# Patient Record
Sex: Male | Born: 1945 | Race: White | Hispanic: No | Marital: Married | Smoking: Former smoker
Health system: Southern US, Community
[De-identification: ages and names within clinical notes are randomized; demographics above are authoritative.]

## PROBLEM LIST (undated history)

## (undated) DIAGNOSIS — I639 Cerebral infarction, unspecified: Secondary | ICD-10-CM

## (undated) DIAGNOSIS — M109 Gout, unspecified: Secondary | ICD-10-CM

## (undated) DIAGNOSIS — E785 Hyperlipidemia, unspecified: Secondary | ICD-10-CM

## (undated) DIAGNOSIS — Z8719 Personal history of other diseases of the digestive system: Secondary | ICD-10-CM

## (undated) DIAGNOSIS — N189 Chronic kidney disease, unspecified: Secondary | ICD-10-CM

## (undated) DIAGNOSIS — C801 Malignant (primary) neoplasm, unspecified: Secondary | ICD-10-CM

## (undated) HISTORY — DX: Hyperlipidemia, unspecified: E78.5

## (undated) HISTORY — DX: Chronic kidney disease, unspecified: N18.9

## (undated) HISTORY — PX: KIDNEY SURGERY: SHX687

## (undated) HISTORY — DX: Cerebral infarction, unspecified: I63.9

## (undated) HISTORY — DX: Malignant (primary) neoplasm, unspecified: C80.1

## (undated) HISTORY — PX: HERNIA REPAIR: SHX51

## (undated) HISTORY — PX: FOOT FRACTURE SURGERY: SHX645

---

## 1998-08-31 ENCOUNTER — Ambulatory Visit (HOSPITAL_COMMUNITY): Admission: RE | Admit: 1998-08-31 | Discharge: 1998-08-31 | Payer: Self-pay | Admitting: Family Medicine

## 1998-08-31 ENCOUNTER — Encounter: Payer: Self-pay | Admitting: Family Medicine

## 1998-09-03 ENCOUNTER — Ambulatory Visit (HOSPITAL_COMMUNITY): Admission: RE | Admit: 1998-09-03 | Discharge: 1998-09-03 | Payer: Self-pay | Admitting: Neurology

## 1998-09-03 ENCOUNTER — Encounter: Payer: Self-pay | Admitting: Neurology

## 1998-09-16 ENCOUNTER — Ambulatory Visit (HOSPITAL_COMMUNITY): Admission: RE | Admit: 1998-09-16 | Discharge: 1998-09-16 | Payer: Self-pay | Admitting: Neurology

## 1998-09-16 ENCOUNTER — Encounter: Payer: Self-pay | Admitting: Neurology

## 1998-09-17 ENCOUNTER — Ambulatory Visit (HOSPITAL_COMMUNITY): Admission: RE | Admit: 1998-09-17 | Discharge: 1998-09-17 | Payer: Self-pay | Admitting: Neurology

## 2001-02-11 ENCOUNTER — Other Ambulatory Visit: Admission: RE | Admit: 2001-02-11 | Discharge: 2001-02-11 | Payer: Self-pay | Admitting: *Deleted

## 2007-06-12 ENCOUNTER — Encounter: Admission: RE | Admit: 2007-06-12 | Discharge: 2007-06-12 | Payer: Self-pay | Admitting: Family Medicine

## 2009-03-24 ENCOUNTER — Encounter: Admission: RE | Admit: 2009-03-24 | Discharge: 2009-03-24 | Payer: Self-pay | Admitting: Family Medicine

## 2010-03-04 ENCOUNTER — Encounter (INDEPENDENT_AMBULATORY_CARE_PROVIDER_SITE_OTHER): Payer: Self-pay | Admitting: Surgery

## 2010-03-04 ENCOUNTER — Inpatient Hospital Stay (HOSPITAL_COMMUNITY): Admission: RE | Admit: 2010-03-04 | Discharge: 2010-03-08 | Payer: Self-pay | Admitting: Surgery

## 2010-05-16 ENCOUNTER — Ambulatory Visit: Payer: Self-pay | Admitting: Diagnostic Radiology

## 2010-05-16 ENCOUNTER — Emergency Department (HOSPITAL_BASED_OUTPATIENT_CLINIC_OR_DEPARTMENT_OTHER): Admission: EM | Admit: 2010-05-16 | Discharge: 2010-05-16 | Payer: Self-pay | Admitting: Emergency Medicine

## 2010-05-17 ENCOUNTER — Observation Stay (HOSPITAL_COMMUNITY): Admission: RE | Admit: 2010-05-17 | Discharge: 2010-05-18 | Payer: Self-pay | Admitting: Urology

## 2010-06-06 ENCOUNTER — Ambulatory Visit (HOSPITAL_COMMUNITY): Admission: RE | Admit: 2010-06-06 | Discharge: 2010-06-06 | Payer: Self-pay | Admitting: Urology

## 2010-11-10 LAB — URINE CULTURE: Colony Count: >=100000

## 2010-11-10 LAB — CBC
HCT: 41.5 % (ref 39.0–52.0)
HCT: 36.8 % — ABNORMAL LOW (ref 39.0–52.0)
Hemoglobin: 14.5 g/dL (ref 13.0–17.0)
MCH: 30 pg (ref 26.0–34.0)
MCH: 30.2 pg (ref 26.0–34.0)
MCHC: 34.5 g/dL (ref 30.0–36.0)
MCHC: 34.6 g/dL (ref 30.0–36.0)
MCHC: 35.2 g/dL (ref 30.0–36.0)
RBC: 4.79 MIL/uL (ref 4.22–5.81)
RBC: 4.8 MIL/uL (ref 4.22–5.81)
RDW: 13.1 % (ref 11.5–15.5)
WBC: 6.9 10*3/uL (ref 4.0–10.5)

## 2010-11-10 LAB — DIFFERENTIAL
Basophils Absolute: 0 10*3/uL (ref 0.0–0.1)
Basophils Absolute: 0.1 10*3/uL (ref 0.0–0.1)
Basophils Relative: 0 % (ref 0–1)
Basophils Relative: 1 % (ref 0–1)
Eosinophils Absolute: 0 10*3/uL (ref 0.0–0.7)
Eosinophils Relative: 0 % (ref 0–5)
Lymphocytes Relative: 4 % — ABNORMAL LOW (ref 12–46)
Lymphs Abs: 0.5 10*3/uL — ABNORMAL LOW (ref 0.7–4.0)
Lymphs Abs: 0.6 10*3/uL — ABNORMAL LOW (ref 0.7–4.0)
Neutro Abs: 7.1 10*3/uL (ref 1.7–7.7)
Neutro Abs: 12 10*3/uL — ABNORMAL HIGH (ref 1.7–7.7)
Neutrophils Relative %: 86 % — ABNORMAL HIGH (ref 43–77)
Neutrophils Relative %: 86 % — ABNORMAL HIGH (ref 43–77)

## 2010-11-10 LAB — CULTURE, BLOOD (ROUTINE X 2): Culture  Setup Time: 201109200227

## 2010-11-10 LAB — BASIC METABOLIC PANEL
BUN: 15 mg/dL (ref 6–23)
BUN: 13 mg/dL (ref 6–23)
BUN: 18 mg/dL (ref 6–23)
CO2: 26 mEq/L (ref 19–32)
CO2: 23 mEq/L (ref 19–32)
Calcium: 9.2 mg/dL (ref 8.4–10.5)
Calcium: 8.4 mg/dL (ref 8.4–10.5)
Calcium: 9.7 mg/dL (ref 8.4–10.5)
Chloride: 108 mEq/L (ref 96–112)
Chloride: 103 mEq/L (ref 96–112)
Chloride: 106 mEq/L (ref 96–112)
Creatinine, Ser: 1.23 mg/dL (ref 0.4–1.5)
GFR calc non Af Amer: 59 mL/min — ABNORMAL LOW (ref >60)
GFR calc non Af Amer: >60 mL/min (ref >60)
Glucose, Bld: 124 mg/dL — ABNORMAL HIGH (ref 70–99)
Glucose, Bld: 145 mg/dL — ABNORMAL HIGH (ref 70–99)
Glucose, Bld: 122 mg/dL — ABNORMAL HIGH (ref 70–99)
Potassium: 3.8 mEq/L (ref 3.5–5.1)

## 2010-11-10 LAB — URINALYSIS, ROUTINE W REFLEX MICROSCOPIC
Bilirubin Urine: NEGATIVE
Glucose, UA: NEGATIVE mg/dL
Ketones, ur: NEGATIVE mg/dL
Protein, ur: 30 mg/dL — AB
Specific Gravity, Urine: 1.014 (ref 1.005–1.030)
pH: 5.5 (ref 5.0–8.0)

## 2010-11-10 LAB — URINE MICROSCOPIC-ADD ON

## 2010-11-10 LAB — SURGICAL PCR SCREEN: MRSA, PCR: NEGATIVE

## 2010-11-13 LAB — COMPREHENSIVE METABOLIC PANEL
Albumin: 4.4 g/dL (ref 3.5–5.2)
Alkaline Phosphatase: 44 U/L (ref 39–117)
BUN: 20 mg/dL (ref 6–23)
CO2: 30 mEq/L (ref 19–32)
Chloride: 104 mEq/L (ref 96–112)
Creatinine, Ser: 1.61 mg/dL — ABNORMAL HIGH (ref 0.4–1.5)
GFR calc non Af Amer: 43 mL/min — ABNORMAL LOW (ref >60)
Potassium: 4.3 mEq/L (ref 3.5–5.1)
Total Bilirubin: 1.3 mg/dL — ABNORMAL HIGH (ref 0.3–1.2)

## 2010-11-13 LAB — CBC
HCT: 35.4 % — ABNORMAL LOW (ref 39.0–52.0)
HCT: 42.6 % (ref 39.0–52.0)
Hemoglobin: 12.7 g/dL — ABNORMAL LOW (ref 13.0–17.0)
Hemoglobin: 14.7 g/dL (ref 13.0–17.0)
MCH: 30.1 pg (ref 26.0–34.0)
MCH: 31.1 pg (ref 26.0–34.0)
MCHC: 35.9 g/dL (ref 30.0–36.0)
MCV: 86.6 fL (ref 78.0–100.0)
MCV: 87.3 fL (ref 78.0–100.0)
RBC: 4.88 MIL/uL (ref 4.22–5.81)
WBC: 10.3 10*3/uL (ref 4.0–10.5)
WBC: 6.5 10*3/uL (ref 4.0–10.5)

## 2011-01-24 ENCOUNTER — Other Ambulatory Visit: Payer: Self-pay | Admitting: Gastroenterology

## 2011-06-19 ENCOUNTER — Ambulatory Visit (HOSPITAL_COMMUNITY)
Admission: RE | Admit: 2011-06-19 | Discharge: 2011-06-19 | Disposition: A | Discharge status: Home / Self Care | Payer: MEDICARE | Source: Ambulatory Visit | Attending: Urology | Admitting: Urology

## 2011-06-19 ENCOUNTER — Other Ambulatory Visit (HOSPITAL_COMMUNITY): Payer: Self-pay | Admitting: Urology

## 2011-06-19 DIAGNOSIS — C649 Malignant neoplasm of unspecified kidney, except renal pelvis: Secondary | ICD-10-CM

## 2011-10-27 IMAGING — CT CT ABD-PELV W/O CM
2 of 3 series · 16 of 46 positions shown, 18 images · non-contrast
Comparison: Abdominal CT 03/24/2009.

CLINICAL DATA: Testicular pain with hematuria and dysuria.  History
of renal cell carcinoma status post left nephrectomy.

CT ABDOMEN AND PELVIS WITHOUT CONTRAST
TECHNIQUE: Multidetector CT imaging of the abdomen and pelvis was
performed following the standard protocol without intravenous
contrast.

[Series 2: renal stone > 200 lbs 5.0 b31f · axial · 0.73mm/px · z∈[+920,+1304]mm · 13 of 89 slices shown, 15 images]
[im 6/89  soft-tissue]
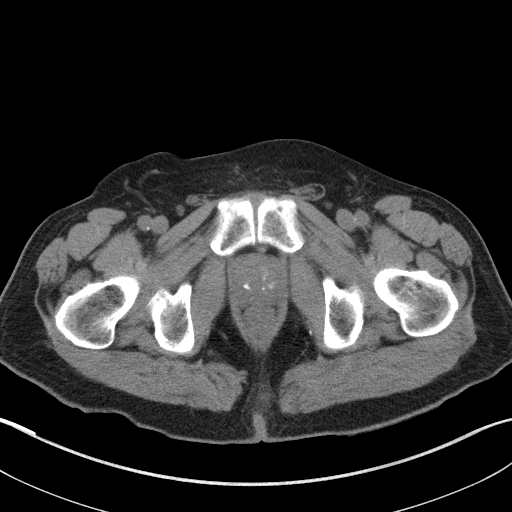
[im 6/89  bone]
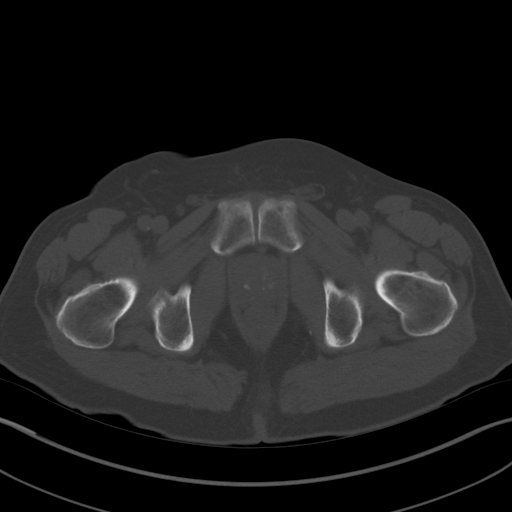
[im 12/89  soft-tissue]
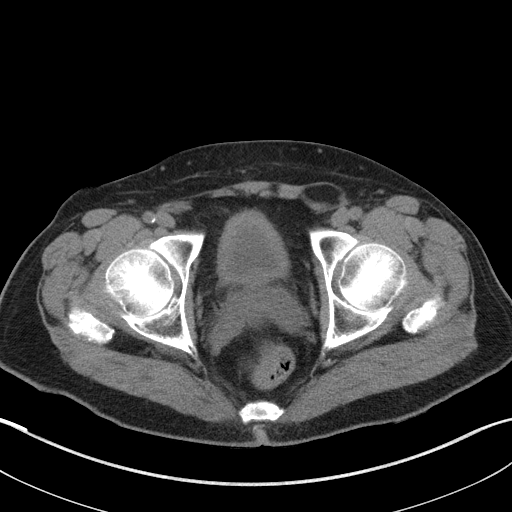
[im 18/89  soft-tissue]
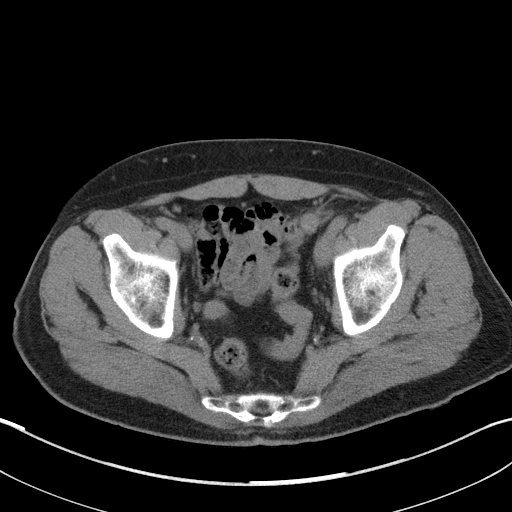
[im 26/89  soft-tissue]
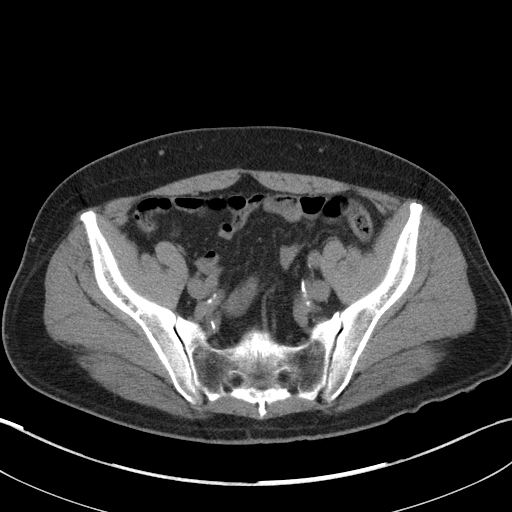
[im 32/89  soft-tissue]
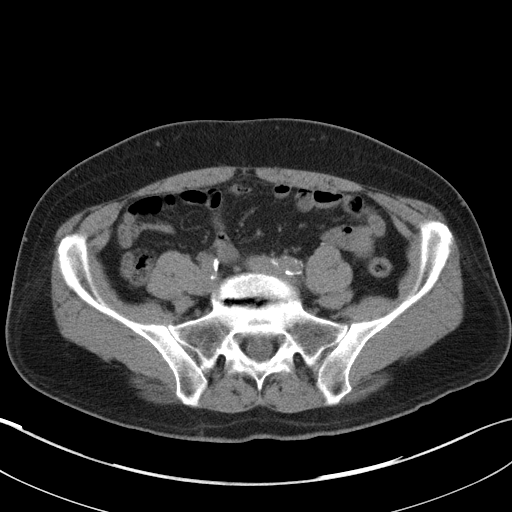
[im 37/89  soft-tissue]
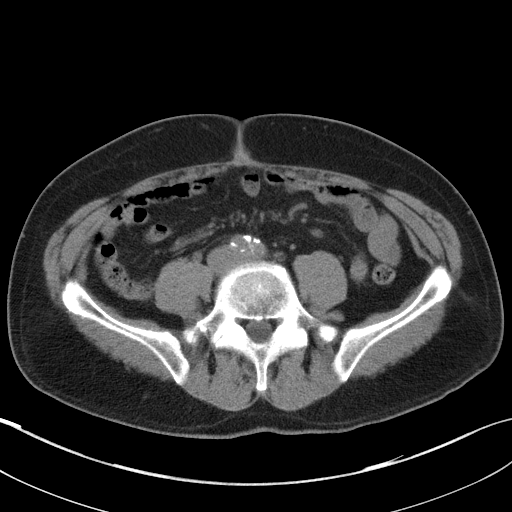
[im 46/89  soft-tissue]
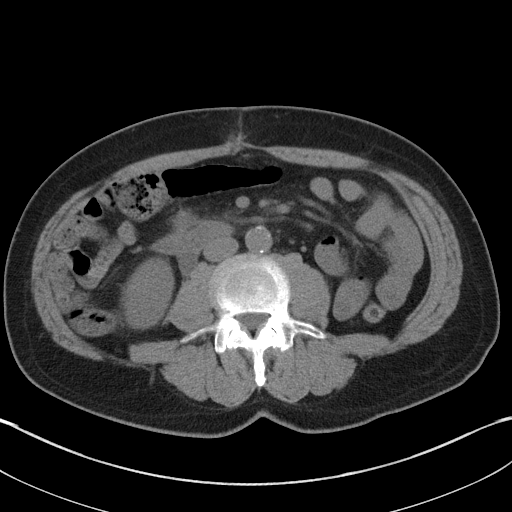
[im 52/89  soft-tissue]
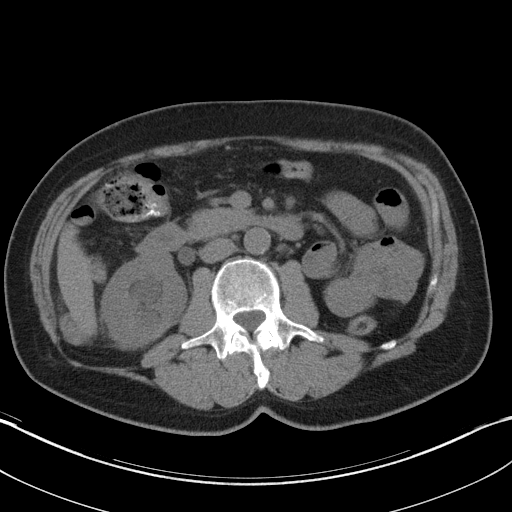
[im 57/89  soft-tissue]
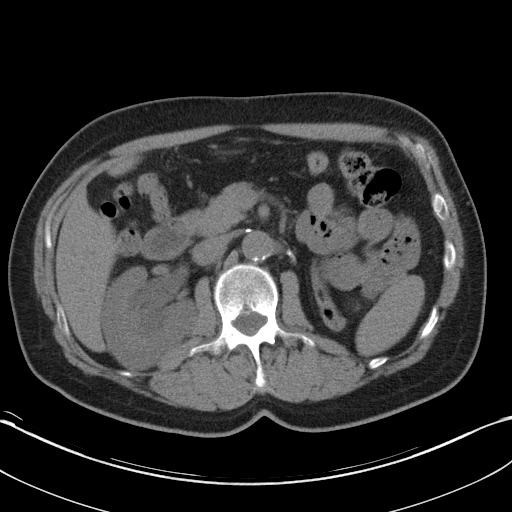
[im 57/89  bone]
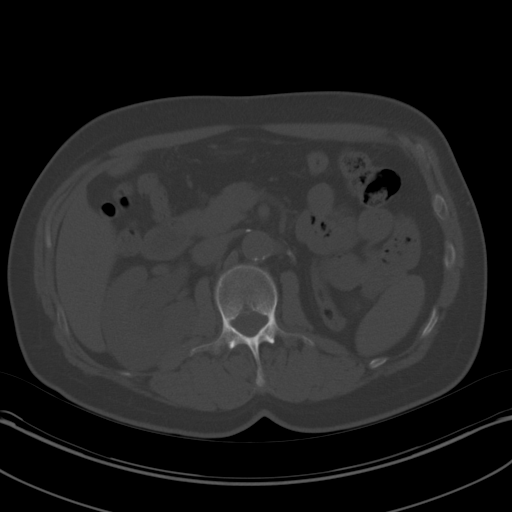
[im 63/89  soft-tissue]
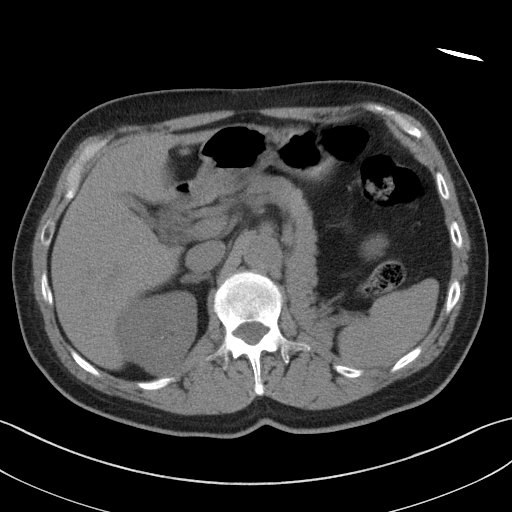
[im 71/89  soft-tissue]
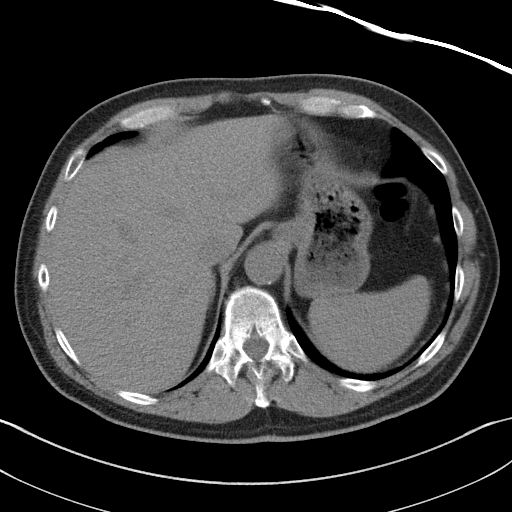
[im 77/89  soft-tissue]
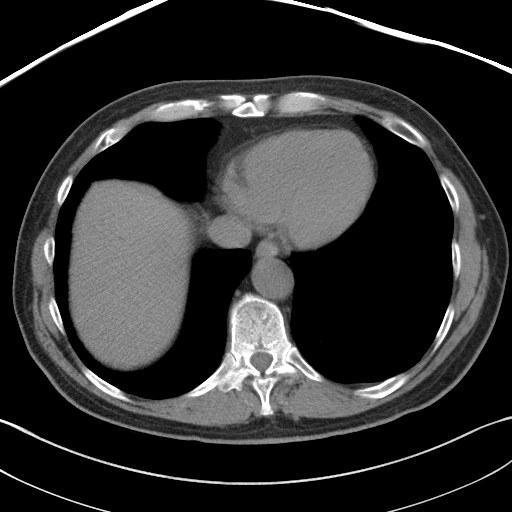
[im 83/89  soft-tissue]
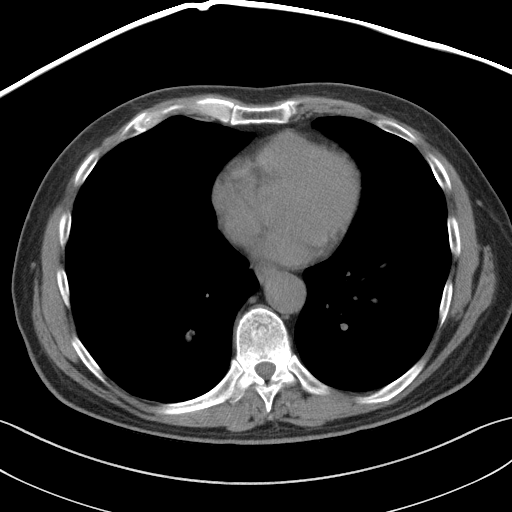

[Series 5: renal stone 3.0 coronal · coronal · 0.69mm/px · 3 of 88 slices shown]
[im 30/88  soft-tissue]
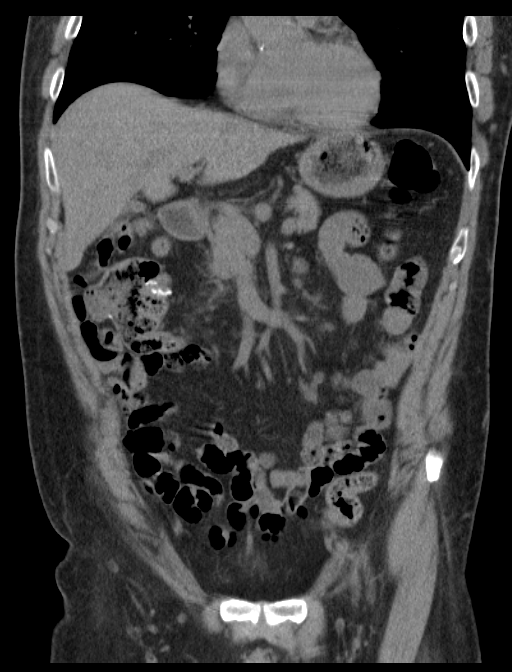
[im 39/88  soft-tissue]
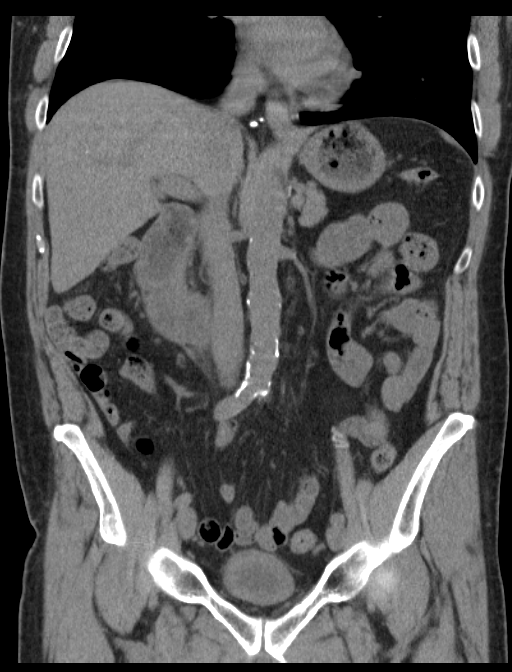
[im 49/88  soft-tissue]
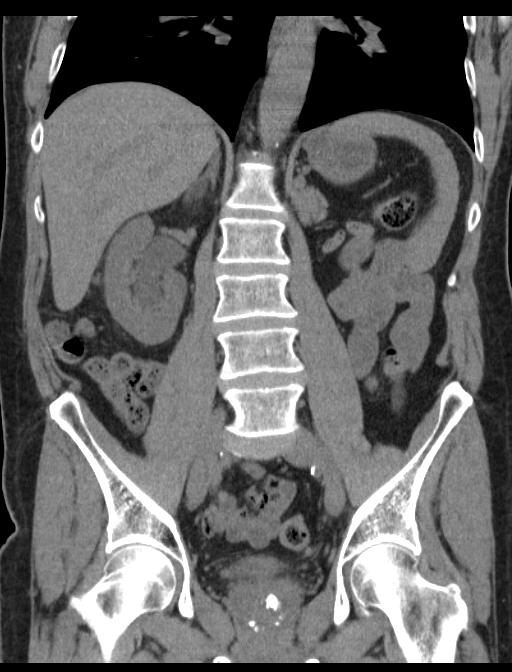

[16 of 46 positions shown; findings below may reference images not displayed]

FINDINGS: Tiny subpleural nodule in the left lower lobe on image 14
and calcified right lower lobe granuloma on image 17 are unchanged.
The lung bases are otherwise clear.

The previously demonstrated calculus in the right renal pelvis has
enlarged and is now located in the upper right ureter at the L4
level.  This measures 8 mm transverse and 13 mm in length.  There
is associated right hydronephrosis and mild perinephric soft tissue
stranding.  There are additional smaller right renal calculi.

The left kidney is surgically absent.  There is no mass in the
nephrectomy bed.  There are no enlarged retroperitoneal lymph
nodes.

The liver, spleen, pancreas and adrenal glands appear normal.  The
gallbladder is contracted without definite abnormality.  There are
postsurgical changes in the anterior abdominal wall.  There has
been previous partial colectomy with reanastomosis.  Prostatic
calcifications and prominent fat in the left inguinal canal are
unchanged.
IMPRESSION: 1.  Obstructing large calculus in the proximal right ureter with
associated hydronephrosis.
2.  Note that the patient has a single right kidney status post
left nephrectomy.  No evidence of metastatic disease.

Critical test results telephoned to Dr. Damqn at the time of
interpretation on 05/16/2010 at 1664 hours.

## 2012-06-14 ENCOUNTER — Ambulatory Visit (HOSPITAL_COMMUNITY)
Admission: RE | Admit: 2012-06-14 | Discharge: 2012-06-14 | Disposition: A | Discharge status: Home / Self Care | Payer: BC Managed Care – PPO | Source: Ambulatory Visit | Attending: Urology | Admitting: Urology

## 2012-06-14 ENCOUNTER — Other Ambulatory Visit (HOSPITAL_COMMUNITY): Payer: Self-pay | Admitting: Urology

## 2012-06-14 DIAGNOSIS — C649 Malignant neoplasm of unspecified kidney, except renal pelvis: Secondary | ICD-10-CM | POA: Insufficient documentation

## 2013-02-21 ENCOUNTER — Encounter (INDEPENDENT_AMBULATORY_CARE_PROVIDER_SITE_OTHER): Payer: Self-pay | Admitting: Surgery

## 2013-02-21 ENCOUNTER — Ambulatory Visit (INDEPENDENT_AMBULATORY_CARE_PROVIDER_SITE_OTHER): Payer: BC Managed Care – PPO | Admitting: Surgery

## 2013-02-21 VITALS — BP 130/74 | HR 98 | Temp 98.6°F | Resp 14 | Ht 70.0 in | Wt 191.4 lb

## 2013-02-21 DIAGNOSIS — K4090 Unilateral inguinal hernia, without obstruction or gangrene, not specified as recurrent: Secondary | ICD-10-CM | POA: Insufficient documentation

## 2013-02-21 NOTE — Progress Notes (Signed)
Re:   Frank Ben[facility]z  "Frank Benitez" DOB:   October 06, 1945 MRN:   [id number]  ASSESSMENT AND PLAN: 1.  Left inguinal hernia, symptomatic  I discussed the indications and complications of hernia surgery with the patient.  I discussed both the laparoscopic and open approach to hernia repair..  The potential risks of hernia surgery include, but are not limited to, bleeding, infection, open surgery, nerve injury, and recurrence of the hernia.  I provided the patient literature about hernia surgery.  I offered for the patient to see Dr. Michaell [facility]z, but the patient has a time commitment and Dr. Michaell [facility]z is not available at this time.  The patient will stay with me for the surgery.  He has a trip to [place], to leave around July 11, but I made Frank promise that I could do his surgery in that narrow of time frame.  2.  History of renal cell ca  Left nephrectomy, laparoscopically, and partial right nephrectomy.  Removed at [facility], [facility] 3.  History of mild CVA (maybe more cerebral hemorrhage)  Frank residual neurologic defect. 4.  Ureteral stones - 05/2010  Solitary right kidney - Dr. Luella [facility]  Note: had a partial right nephrectomy 5.  Gout 6.  Hypercholesterolemia 7.  Right colon resection for benign tumor,  enterolysis of adhesions, primary repair of ventral hernia - 03/07/2010 - Frank [facility]  Chief Complaint  Patient presents with  . New Evaluation    eval lt ing hernia   REFERRING PHYSICIAN: FRIED, Frank L, MD  HISTORY OF PRESENT ILLNESS: VERSHAWN Benitez is a 67 y.o. (DOB: [birth year]) white  male whose primary care physician is Ben[facility]z, Frank L, MD and comes to me today for a symptomatic left inguinal pain.  The patient had a right hemicolectomy by Dr. Estelle [facility] on 03/04/2010. This was for a benign mass in his right colon.   At surgery, Dr. Michaell [facility] noted adhesions, a ventral hernia (which he repaired), and a left inguinal hernia. In his operative note, he questioned a right groin weakness.  At that  time the inguinal hernia was asymptomatic and not noticed by the patient.  The patient saw Dr. Dewaine [facility] about 6 months ago who noted a left inguinal hernia.  Still at that time, the patient had had Frank symptoms.  He has begun an exercise program with his family where he is walking 3-4 times per day. Yesterday, he was walking and had terrible pain in his left groin. He did not feel a bulge, but pressure on the groin helped. Today, his left groin has a burning sensation and bruised feeling, but is better than yesterday.  He is here to discuss surgical repair of his left inguinal hernia.  Besides the colon surgery in 2011, he had a left nephrectomy and partial right nephrectomy in 2003 at [facility].     Frank past medical history on file.   Frank past surgical history on file.    Frank current outpatient prescriptions on file.   Frank current facility-administered medications for this visit.     Frank [facility]  REVIEW OF SYSTEMS: Skin:  Frank history of rash.  Frank history of abnormal moles. Infection:  Frank history of hepatitis or HIV.  Frank history of MRSA. Neurologic:  History of mild CVA (maybe more cerebral hemorrhage) - 1999. Cardiac:  Frank history of hypertension. Frank history of heart disease.  Frank history of prior cardiac catheterization.  Frank history of seeing a cardiologist. Pulmonary:  Quit  smoking 2003. Frank asthma or bronchitis.  Frank OSA/CPAP.  Endocrine:  Frank diabetes. Frank thyroid disease. Gastrointestinal:  Frank history of stomach disease.  Frank history of liver disease.  Frank history of gall bladder disease.  Frank history of pancreas disease.  Right colon resection for benign tumor,enterolysis of adhesions, primary repair of ventral hernia - 03/07/2010 - Frank [facility].  Dr. Bosie [facility]z is gastroenterologist. Urologic:  History of renal cell ca.  Left nephrectomy, laparoscopically, and partial right nephrectomy. Removed at [facility], 2003.  Ureteral stones - 05/2010- Dr. Luella [facility].  Normal creatinine on  labs from 05/17/2012. Musculoskeletal:  Foot injury 2002. Hematologic:  Frank bleeding disorder.  Frank history of anemia.  Not anticoagulated. Psycho-social:  The patient is oriented.   The patient has Frank obvious psychologic or social impairment to understanding our conversation and plan.  SOCIAL and FAMILY HISTORY: Wife and daughter are with the patient. He is retired from [employer].  H e spent 6 years in [place], [place].  PHYSICAL EXAM: BP 130/74  Pulse 98  Temp(Src) 98.6 F (37 C) (Temporal)  Resp 14  Ht 5\' 10"  (1.778 m)  Wt 191 lb 6.4 oz (86.818 kg)  BMI 27.46 kg/m2  General: WN WM who is alert and generally healthy appearing.  HEENT: Normal. Pupils equal. Neck: Supple. Frank mass.  Frank thyroid mass. Lymph Nodes:  Frank supraclavicular or cervical nodes. Lungs: Clear to auscultation and symmetric breath sounds. Heart:  RRR. Frank murmur or rub. Abdomen: Soft. Frank mass. Frank tenderness. Normal bowel sounds. Right flank scar.  Epigastric scar.  Left inguinal hernia, medium, reducible.  I do not feel an obvious right inguinal hernia. Rectal: Not done. Extremities:  Good strength and ROM  in upper and lower extremities. Neurologic:  Grossly intact to motor and sensory function. Psychiatric: Has normal mood and affect. Behavior is normal.   DATA REVIEWED: Old chart and Epic  Ovidio Kin, MD,  [facility] [address]  [address]   [place]    [address] Phone:  [phone] FAX:  [phone]

## 2013-02-24 ENCOUNTER — Other Ambulatory Visit (INDEPENDENT_AMBULATORY_CARE_PROVIDER_SITE_OTHER): Payer: Self-pay | Admitting: Surgery

## 2013-02-24 ENCOUNTER — Telehealth (INDEPENDENT_AMBULATORY_CARE_PROVIDER_SITE_OTHER): Payer: Self-pay | Admitting: *Deleted

## 2013-02-24 ENCOUNTER — Encounter (HOSPITAL_COMMUNITY): Payer: Self-pay | Admitting: Pharmacy Technician

## 2013-02-24 ENCOUNTER — Encounter (HOSPITAL_COMMUNITY): Payer: Self-pay | Admitting: Surgery

## 2013-02-24 NOTE — Telephone Encounter (Signed)
Jan from pre-admit called to ask for surgical orders to be placed please.

## 2013-02-25 ENCOUNTER — Ambulatory Visit (HOSPITAL_COMMUNITY)
Admission: RE | Admit: 2013-02-25 | Discharge: 2013-02-25 | Disposition: A | Discharge status: Home / Self Care | Payer: MEDICARE | Source: Ambulatory Visit | Attending: Surgery | Admitting: Surgery

## 2013-02-25 ENCOUNTER — Encounter (HOSPITAL_COMMUNITY): Payer: Self-pay | Admitting: Anesthesiology

## 2013-02-25 ENCOUNTER — Ambulatory Visit (HOSPITAL_COMMUNITY): Payer: MEDICARE | Admitting: Certified Registered"

## 2013-02-25 ENCOUNTER — Encounter (HOSPITAL_COMMUNITY)
Admission: RE | Disposition: A | Discharge status: Home / Self Care | Payer: Self-pay | Source: Ambulatory Visit | Attending: Surgery

## 2013-02-25 ENCOUNTER — Encounter (HOSPITAL_COMMUNITY): Payer: Self-pay | Admitting: Certified Registered"

## 2013-02-25 DIAGNOSIS — Z8673 Personal history of transient ischemic attack (TIA), and cerebral infarction without residual deficits: Secondary | ICD-10-CM | POA: Insufficient documentation

## 2013-02-25 DIAGNOSIS — M109 Gout, unspecified: Secondary | ICD-10-CM | POA: Insufficient documentation

## 2013-02-25 DIAGNOSIS — Z905 Acquired absence of kidney: Secondary | ICD-10-CM | POA: Insufficient documentation

## 2013-02-25 DIAGNOSIS — Z9049 Acquired absence of other specified parts of digestive tract: Secondary | ICD-10-CM | POA: Insufficient documentation

## 2013-02-25 DIAGNOSIS — K4090 Unilateral inguinal hernia, without obstruction or gangrene, not specified as recurrent: Secondary | ICD-10-CM

## 2013-02-25 DIAGNOSIS — E7800 Pure hypercholesterolemia, unspecified: Secondary | ICD-10-CM | POA: Insufficient documentation

## 2013-02-25 DIAGNOSIS — Z5331 Laparoscopic surgical procedure converted to open procedure: Secondary | ICD-10-CM | POA: Insufficient documentation

## 2013-02-25 DIAGNOSIS — Z85528 Personal history of other malignant neoplasm of kidney: Secondary | ICD-10-CM | POA: Insufficient documentation

## 2013-02-25 HISTORY — DX: Gout, unspecified: M10.9

## 2013-02-25 HISTORY — PX: INGUINAL HERNIA REPAIR: SHX194

## 2013-02-25 HISTORY — DX: Personal history of other diseases of the digestive system: Z87.19

## 2013-02-25 LAB — BASIC METABOLIC PANEL
BUN: 17 mg/dL (ref 6–23)
CO2: 26 mEq/L (ref 19–32)
Calcium: 9.4 mg/dL (ref 8.4–10.5)
Creatinine, Ser: 0.96 mg/dL (ref 0.50–1.35)
Glucose, Bld: 94 mg/dL (ref 70–99)

## 2013-02-25 LAB — CBC
HCT: 44.1 % (ref 39.0–52.0)
Hemoglobin: 15.6 g/dL (ref 13.0–17.0)
MCH: 30.6 pg (ref 26.0–34.0)
MCV: 86.6 fL (ref 78.0–100.0)
RBC: 5.09 MIL/uL (ref 4.22–5.81)

## 2013-02-25 SURGERY — REPAIR, HERNIA, INGUINAL, LAPAROSCOPIC
Anesthesia: General | Site: Groin | Wound class: Clean

## 2013-02-25 MED ORDER — ARTIFICIAL TEARS OP OINT
TOPICAL_OINTMENT | OPHTHALMIC | Status: DC | PRN
  Administered 2013-02-25: 1 via OPHTHALMIC

## 2013-02-25 MED ORDER — ACETAMINOPHEN 10 MG/ML IV SOLN
1000.0000 mg | Freq: Once | INTRAVENOUS | Status: AC
  Administered 2013-02-25: 1000 mg via INTRAVENOUS
  Filled 2013-02-25: qty 100

## 2013-02-25 MED ORDER — CHLORHEXIDINE GLUCONATE 4 % EX LIQD: 1.0000 "application " | Freq: Once | CUTANEOUS | Status: DC

## 2013-02-25 MED ORDER — OXYCODONE HCL 5 MG PO TABS
ORAL_TABLET | ORAL | Status: AC
  Filled 2013-02-25: qty 1

## 2013-02-25 MED ORDER — FENTANYL CITRATE 0.05 MG/ML IJ SOLN
INTRAMUSCULAR | Status: DC | PRN
  Administered 2013-02-25 (×3): 50 ug via INTRAVENOUS
  Administered 2013-02-25: 100 ug via INTRAVENOUS

## 2013-02-25 MED ORDER — LIDOCAINE HCL (CARDIAC) 20 MG/ML IV SOLN
INTRAVENOUS | Status: DC | PRN
  Administered 2013-02-25: 80 mg via INTRAVENOUS

## 2013-02-25 MED ORDER — HYDROMORPHONE HCL PF 1 MG/ML IJ SOLN: 0.2500 mg | INTRAMUSCULAR | Status: DC | PRN

## 2013-02-25 MED ORDER — MIDAZOLAM HCL 5 MG/5ML IJ SOLN
INTRAMUSCULAR | Status: DC | PRN
  Administered 2013-02-25: 2 mg via INTRAVENOUS

## 2013-02-25 MED ORDER — NEOSTIGMINE METHYLSULFATE 1 MG/ML IJ SOLN
INTRAMUSCULAR | Status: DC | PRN
  Administered 2013-02-25: 4 mg via INTRAVENOUS

## 2013-02-25 MED ORDER — CEFAZOLIN SODIUM-DEXTROSE 2-3 GM-% IV SOLR
INTRAVENOUS | Status: AC
  Administered 2013-02-25: 2 g via INTRAVENOUS
  Filled 2013-02-25: qty 50

## 2013-02-25 MED ORDER — ONDANSETRON HCL 4 MG/2ML IJ SOLN
INTRAMUSCULAR | Status: DC | PRN
  Administered 2013-02-25 (×2): 4 mg via INTRAVENOUS

## 2013-02-25 MED ORDER — SODIUM CHLORIDE 0.9 % IJ SOLN
INTRAMUSCULAR | Status: DC | PRN
  Administered 2013-02-25: 16:00:00

## 2013-02-25 MED ORDER — GLYCOPYRROLATE 0.2 MG/ML IJ SOLN
INTRAMUSCULAR | Status: DC | PRN
  Administered 2013-02-25: 0.6 mg via INTRAVENOUS
  Administered 2013-02-25: 0.2 mg via INTRAVENOUS

## 2013-02-25 MED ORDER — METOCLOPRAMIDE HCL 5 MG/ML IJ SOLN: 10.0000 mg | Freq: Once | INTRAMUSCULAR | Status: DC | PRN

## 2013-02-25 MED ORDER — CEFAZOLIN SODIUM-DEXTROSE 2-3 GM-% IV SOLR: 2.0000 g | INTRAVENOUS | Status: DC

## 2013-02-25 MED ORDER — SODIUM CHLORIDE 0.9 % IR SOLN
Status: DC | PRN
  Administered 2013-02-25: 1000 mL

## 2013-02-25 MED ORDER — METOCLOPRAMIDE HCL 5 MG/ML IJ SOLN
INTRAMUSCULAR | Status: DC | PRN
  Administered 2013-02-25: 10 mg via INTRAVENOUS

## 2013-02-25 MED ORDER — LACTATED RINGERS IV SOLN
INTRAVENOUS | Status: DC | PRN
  Administered 2013-02-25: 14:00:00 via INTRAVENOUS

## 2013-02-25 MED ORDER — ROCURONIUM BROMIDE 100 MG/10ML IV SOLN
INTRAVENOUS | Status: DC | PRN
  Administered 2013-02-25: 50 mg via INTRAVENOUS
  Administered 2013-02-25: 10 mg via INTRAVENOUS

## 2013-02-25 MED ORDER — PROPOFOL 10 MG/ML IV BOLUS
INTRAVENOUS | Status: DC | PRN
  Administered 2013-02-25: 200 mg via INTRAVENOUS

## 2013-02-25 MED ORDER — BUPIVACAINE-EPINEPHRINE 0.25% -1:200000 IJ SOLN
INTRAMUSCULAR | Status: DC | PRN
  Administered 2013-02-25: 2.5 mL

## 2013-02-25 MED ORDER — OXYCODONE HCL 5 MG/5ML PO SOLN: 5.0000 mg | Freq: Once | ORAL | Status: AC | PRN

## 2013-02-25 MED ORDER — DEXAMETHASONE SODIUM PHOSPHATE 10 MG/ML IJ SOLN
INTRAMUSCULAR | Status: DC | PRN
  Administered 2013-02-25: 10 mg via INTRAVENOUS

## 2013-02-25 MED ORDER — BUPIVACAINE LIPOSOME 1.3 % IJ SUSP
20.0000 mL | Freq: Once | INTRAMUSCULAR | Status: DC
  Filled 2013-02-25: qty 20

## 2013-02-25 MED ORDER — HYDROCODONE-ACETAMINOPHEN 5-325 MG PO TABS: 1.0000 | ORAL_TABLET | Freq: Four times a day (QID) | ORAL | Status: AC | PRN

## 2013-02-25 MED ORDER — OXYCODONE HCL 5 MG PO TABS
5.0000 mg | ORAL_TABLET | Freq: Once | ORAL | Status: AC | PRN
  Administered 2013-02-25: 5 mg via ORAL

## 2013-02-25 MED ORDER — BUPIVACAINE-EPINEPHRINE PF 0.25-1:200000 % IJ SOLN
INTRAMUSCULAR | Status: AC
  Filled 2013-02-25: qty 30

## 2013-02-25 SURGICAL SUPPLY — 50 items
APPLIER CLIP LOGIC TI 5 (MISCELLANEOUS) IMPLANT
BLADE SURG 10 STRL SS (BLADE) ×3 IMPLANT
BLADE SURG ROTATE 9660 (MISCELLANEOUS) IMPLANT
CANISTER SUCTION 2500CC (MISCELLANEOUS) IMPLANT
CLOTH BEACON ORANGE TIMEOUT ST (SAFETY) ×3 IMPLANT
COVER SURGICAL LIGHT HANDLE (MISCELLANEOUS) ×3 IMPLANT
DERMABOND ADHESIVE PROPEN (GAUZE/BANDAGES/DRESSINGS) ×2
DERMABOND ADVANCED (GAUZE/BANDAGES/DRESSINGS) ×1
DERMABOND ADVANCED .7 DNX12 (GAUZE/BANDAGES/DRESSINGS) ×2 IMPLANT
DERMABOND ADVANCED .7 DNX6 (GAUZE/BANDAGES/DRESSINGS) ×4 IMPLANT
DISSECT BALLN SPACEMKR + OVL (BALLOONS) ×3
DISSECTOR BALLN SPACEMKR + OVL (BALLOONS) ×2 IMPLANT
DISSECTOR BLUNT TIP ENDO 5MM (MISCELLANEOUS) IMPLANT
DRAIN PENROSE 1/2X12 LTX STRL (WOUND CARE) ×3 IMPLANT
DRAPE UTILITY 15X26 W/TAPE STR (DRAPE) ×6 IMPLANT
ELECT CAUTERY BLADE 6.4 (BLADE) ×3 IMPLANT
ELECT REM PT RETURN 9FT ADLT (ELECTROSURGICAL) ×3
ELECTRODE REM PT RTRN 9FT ADLT (ELECTROSURGICAL) ×2 IMPLANT
GLOVE BIO SURGEON STRL SZ7.5 (GLOVE) ×6 IMPLANT
GLOVE BIOGEL PI IND STRL 6.5 (GLOVE) ×2 IMPLANT
GLOVE BIOGEL PI IND STRL 7.0 (GLOVE) ×2 IMPLANT
GLOVE BIOGEL PI IND STRL 7.5 (GLOVE) ×4 IMPLANT
GLOVE BIOGEL PI INDICATOR 6.5 (GLOVE) ×1
GLOVE BIOGEL PI INDICATOR 7.0 (GLOVE) ×1
GLOVE BIOGEL PI INDICATOR 7.5 (GLOVE) ×2
GLOVE SURG SIGNA 7.5 PF LTX (GLOVE) ×3 IMPLANT
GLOVE SURG SS PI 6.0 STRL IVOR (GLOVE) ×3 IMPLANT
GLOVE SURG SS PI 7.0 STRL IVOR (GLOVE) ×3 IMPLANT
GOWN STRL NON-REIN LRG LVL3 (GOWN DISPOSABLE) ×9 IMPLANT
GOWN STRL REIN XL XLG (GOWN DISPOSABLE) ×3 IMPLANT
KIT BASIN OR (CUSTOM PROCEDURE TRAY) ×3 IMPLANT
KIT ROOM TURNOVER OR (KITS) ×3 IMPLANT
MESH ULTRAPRO 3X6 7.6X15CM (Mesh General) ×3 IMPLANT
NS IRRIG 1000ML POUR BTL (IV SOLUTION) ×3 IMPLANT
PAD ARMBOARD 7.5X6 YLW CONV (MISCELLANEOUS) ×6 IMPLANT
PENCIL BUTTON HOLSTER BLD 10FT (ELECTRODE) ×3 IMPLANT
SCISSORS LAP 5X35 DISP (ENDOMECHANICALS) IMPLANT
SET IRRIG TUBING LAPAROSCOPIC (IRRIGATION / IRRIGATOR) ×3 IMPLANT
SUT NOVA NAB GS-21 0 18 T12 DT (SUTURE) ×9 IMPLANT
SUT PROLENE 0 SH 30 (SUTURE) IMPLANT
SUT VIC AB 3-0 SH 18 (SUTURE) ×3 IMPLANT
SUT VIC AB 5-0 PS2 18 (SUTURE) ×3 IMPLANT
TACKER 5MM HERNIA 3.5CML NAB (ENDOMECHANICALS) ×3 IMPLANT
TOWEL OR 17X24 6PK STRL BLUE (TOWEL DISPOSABLE) ×3 IMPLANT
TOWEL OR 17X26 10 PK STRL BLUE (TOWEL DISPOSABLE) ×3 IMPLANT
TRAY FOLEY CATH 14FR (SET/KITS/TRAYS/PACK) ×3 IMPLANT
TRAY LAPAROSCOPIC (CUSTOM PROCEDURE TRAY) ×3 IMPLANT
TROCAR XCEL NON-BLD 5MMX100MML (ENDOMECHANICALS) ×6 IMPLANT
TUBE CONNECTING 12X1/4 (SUCTIONS) ×3 IMPLANT
YANKAUER SUCT BULB TIP NO VENT (SUCTIONS) ×3 IMPLANT

## 2013-02-25 NOTE — Transfer of Care (Signed)
Immediate Anesthesia Transfer of Care Note  Patient: Frank [facility]z  Procedure(s) Performed: Procedure(s) with comments: LAPAROSCOPIC INGUINAL HERNIA (N/A) - attepted laparoscopic, converted to open HERNIA REPAIR INGUINAL ADULT (Left)  Patient Location: PACU  Anesthesia Type:General  Level of Consciousness: awake, alert  and oriented  Airway & Oxygen Therapy: Patient connected to face mask  Post-op Assessment: Report given to PACU RN  Post vital signs: stable  Complications: No apparent anesthesia complications

## 2013-02-25 NOTE — Op Note (Signed)
02/25/2013  4:38 PM  PATIENT:  Frank Benitez, 67 y.o., male, MRN: [id number]  PREOP DIAGNOSIS:  left inguinal hernia, symptomatic  POSTOP DIAGNOSIS:   Indirect left inguinal hernia (medium sized)  PROCEDURE:   Procedure(s): LAPAROSCOPIC converted to open left HERNIA REPAIR INGUINAL ADULT  SURGEON:   Ovidio Kin, M.D.  ANESTHESIA:   general  Anesthesiologist: Hart Robinsons, MD CRNA: Ellin Goodie, CRNA  General  EBL:  minimal  ml  LOCAL MEDICATIONS USED:   20 cc Exparel (mixed with 10 cc saline)  SPECIMEN:   Hernia sac  COUNTS CORRECT:  YES  INDICATIONS FOR PROCEDURE:  Frank Benitez is a 67 y.o. (DOB: 06-Jan-1946) white  male whose primary care physician is FRIED, ROBERT L, MD and comes for repair of left inguinal hernia.   The indications and risks of the hernia surgery were explained to the patient.  The risks include, but are not limited to, infection, bleeding, possible open repair, recurrence of the hernia, and nerve injury.  Operative Note: The patient was taken to room number 2 at [facility] OR.  He underwent a general anesthesia.    A time out was held and the surgical checklist run.  His lower abdomen was shaved and then prepped with chloroprep.  I first tried to do a laparoscopic inguinal hernia repair.  I made an infraumbilical incision down to the rectus fascia.  I went through the anterior left rectus fascia, retracted the rectus muscle anteriorly, and passed the PBD balloon into the preperitoneal space.  Inder direct laparoscopic inspection I insufflated the pre peritoneal balloon.  I got a moderate dissection, but had trouble keeping the Hasson trocar in the preperitoneal space.  I placed a 5 mm trocar in the RLQ for better exposure, but still did not have good distention or exposure.  I saw no peritoneal tear, but just could not get the Hasson trocar to seat in the right place and could not get good exposure.  So I though that he would be best served doing an  open repair rather than persisting laparoscopically with poor visualization.  I closed the anterior rectus fascia with a 0 vicryl suture.   A left inguinal incision was made through the subcutaneous fat to the external oblique fascia.  The external ring was opened and the cord structures encircled with a penrose drain.    The external ring was opened and the cord structures encircled with a penrose drain.  The patient had a medium sized indirect left inguinal hernia. The patient had a hernia sac the came along the anterior medial portion of the cord structures.  The sac was dissected free of the cord structures.  The sac was about 7 cm long and 2 cm wide. The sac was opened and went to the peritoneal cavity.  There was no palpable mass within the peritoneal cavity.  The sac was ligated with a 2-0 Vicryl suture.  The inguinal floor was in good shape.  The inguinal floor was repaired with a 3 x 6 inch piece of Ultrapro mesh.  The mesh was cut to fit the inguinal floor.  The mesh was sewn in place with interrupted 0 Novafil suture.  A key hole was made for the internal ring.  The mesh lay flat.  The inguinal floor was covered and the internal ring recreated.  The cord structures were returned to a normal location.  The external oblique was closed with a 3-0 vicryl.  The fascia and subcutaneous tissues  were infiltrated with 30 cc of a mixture of Exparel (20 cc) with saline (10 cc).  The skin incisions were closed with 5-0 monocryl and painted with Dermabond. The sponge and needle count were correct at the end of the case.  The patient was transported to the recovery room in good condition.  The patient will go home today.  Discharge instructions reviewed.  Ovidio Kin, MD, [facility] Pager: [phone] Office phone:  [phone]

## 2013-02-25 NOTE — Anesthesia Procedure Notes (Signed)
Procedure Name: Intubation Date/Time: 02/25/2013 2:25 PM Performed by: Ellin Goodie Pre-anesthesia Checklist: Patient identified, Emergency Drugs available, Suction available, Patient being monitored and Timeout performed Patient Re-evaluated:Patient Re-evaluated prior to inductionOxygen Delivery Method: Circle system utilized Preoxygenation: Pre-oxygenation with 100% oxygen Intubation Type: IV induction Ventilation: Mask ventilation without difficulty Laryngoscope Size: Mac and 4 Grade View: Grade I Tube type: Oral Tube size: 7.5 mm Number of attempts: 1 Airway Equipment and Method: Stylet Placement Confirmation: ETT inserted through vocal cords under direct vision,  positive ETCO2 and breath sounds checked- equal and bilateral Secured at: 23 cm Tube secured with: Tape Dental Injury: Teeth and Oropharynx as per pre-operative assessment  Comments: Easy atraumatic induction and intubation with MAC 4 blade.  Dr. Gelene Mink verified placement of ETT.  Carlynn Herald, CRNA

## 2013-02-25 NOTE — Interval H&P Note (Signed)
History and Physical Interval Note:  02/25/2013 2:06 PM  Frank Benitez  has presented today for surgery, with the diagnosis of hernia  The various methods of treatment have been discussed with the patient and family.  Frank Benitez is here with him.  After consideration of risks, benefits and other options for treatment, the patient has consented to  Procedure(s): LAPAROSCOPIC INGUINAL HERNIA (Left) as a surgical intervention .    The patient's history has been reviewed, patient examined, no change in status, stable for surgery.  I have reviewed the patient's chart and labs.  Questions were answered to the patient's satisfaction.     Evalyse Stroope H

## 2013-02-25 NOTE — H&P (View-Only) (Signed)
 Re:   Frank Benitez  "Frank Benitez" DOB:   10/27/1945 MRN:   [id number]  ASSESSMENT AND PLAN: 1.  Left inguinal hernia, symptomatic  I discussed the indications and complications of hernia surgery with the patient.  I discussed both the laparoscopic and open approach to hernia repair..  The potential risks of hernia surgery include, but are not limited to, bleeding, infection, open surgery, nerve injury, and recurrence of the hernia.  I provided the patient literature about hernia surgery.  I offered for the patient to see Frank Benitez, but the patient has a time commitment and Frank Benitez is not available at this time.  The patient will stay with me for the surgery.  He has a trip to [place], to leave around July 11, but I made no promise that I could do his surgery in that narrow of time frame.  2.  History of renal cell ca  Left nephrectomy, laparoscopically, and partial right nephrectomy.  Removed at [facility] [place], 2003 3.  History of mild CVA (maybe more cerebral hemorrhage)  No residual neurologic defect. 4.  Ureteral stones - 05/2010  Solitary right kidney - Frank [facility]  Note: had a partial right nephrectomy 5.  Gout 6.  Hypercholesterolemia 7.  Right colon resection for benign tumor,  enterolysis of adhesions, primary repair of ventral hernia - 03/07/2010 - S. Frank [facility]  Chief Complaint  Patient presents with  . New Evaluation    eval lt ing hernia   REFERRING PHYSICIAN: FRIED, Frank [facility], [facility]  HISTORY OF PRESENT ILLNESS: Frank Benitez is a 67 y.o. (DOB: 04/27/1946) white  male whose primary care physician is Frank Benitez and comes to me today for a symptomatic left inguinal pain.  The patient had a right hemicolectomy by Dr. Steve Frank [facility] on 03/04/2010. This was for a benign mass in his right colon.   At surgery, Frank [facility]z noted adhesions, a ventral hernia (which he repaired), and a left inguinal hernia. In his operative note, he questioned a right groin weakness.  At that  time the inguinal hernia was asymptomatic and not noticed by the patient.  The patient saw Frank Benitez about 6 months ago who noted a left inguinal hernia.  Still at that time, the patient had had no symptoms.  He has begun an exercise program with his family where he is walking 3-4 times per day. Yesterday, he was walking and had terrible pain in his left groin. He did not feel a bulge, but pressure on the groin helped. Today, his left groin has a burning sensation and bruised feeling, but is better than yesterday.  He is here to discuss surgical repair of his left inguinal hernia.  Besides the colon surgery in 2011, he had a left nephrectomy and partial right nephrectomy in 2003 at [facility].     No past medical history on file.   No past surgical history on file.    No current outpatient prescriptions on file.   No current facility-administered medications for this visit.     No Known Allergies  REVIEW OF SYSTEMS: Skin:  No history of rash.  No history of abnormal moles. Infection:  No history of hepatitis or HIV.  No history of MRSA. Neurologic:  History of mild CVA (maybe more cerebral hemorrhage) - 1999. Cardiac:  No history of hypertension. No history of heart disease.  No history of prior cardiac catheterization.  No history of seeing a cardiologist. Pulmonary:  Quit   smoking 2003. No asthma or bronchitis.  No OSA/CPAP.  Endocrine:  No diabetes. No thyroid disease. Gastrointestinal:  No history of stomach disease.  No history of liver disease.  No history of gall bladder disease.  No history of pancreas disease.  Right colon resection for benign tumor,enterolysis of adhesions, primary repair of ventral hernia - 03/07/2010 - S. [facility].  Frank Benitez is gastroenterologist. Urologic:  History of renal cell ca.  Left nephrectomy, laparoscopically, and partial right nephrectomy. Removed at [facility], [place], 2003.  Ureteral stones - 05/2010- Frank [facility].  Normal creatinine on  labs from 05/17/2012. Musculoskeletal:  Foot injury 2002. Hematologic:  No bleeding disorder.  No history of anemia.  Not anticoagulated. Psycho-social:  The patient is oriented.   The patient has no obvious psychologic or social impairment to understanding our conversation and plan.  SOCIAL and FAMILY HISTORY: Wife and daughter are with the patient. He is retired from [employer].  H e spent 6 years in [place], [place].  PHYSICAL EXAM: BP 130/74  Pulse 98  Temp(Src) 98.6 F (37 C) (Temporal)  Resp 14  Ht 5' 10" (1.778 m)  Wt 191 lb 6.4 oz (86.818 kg)  BMI 27.46 kg/m2  General: WN WM who is alert and generally healthy appearing.  HEENT: Normal. Pupils equal. Neck: Supple. No mass.  No thyroid mass. Lymph Nodes:  No supraclavicular or cervical nodes. Lungs: Clear to auscultation and symmetric breath sounds. Heart:  RRR. No murmur or rub. Abdomen: Soft. No mass. No tenderness. Normal bowel sounds. Right flank scar.  Epigastric scar.  Left inguinal hernia, medium, reducible.  I do not feel an obvious right inguinal hernia. Rectal: Not done. Extremities:  Good strength and ROM  in upper and lower extremities. Neurologic:  Grossly intact to motor and sensory function. Psychiatric: Has normal mood and affect. Behavior is normal.   DATA REVIEWED: Old chart and Epic  Adynn Caseres, [facility],  FACS [facility] [address]  [address]   [place], [place]    [address] Phone:  [phone] FAX:  [phone]  

## 2013-02-25 NOTE — Anesthesia Preprocedure Evaluation (Addendum)
Anesthesia Evaluation  Patient identified by MRN, date of birth, ID band Patient awake    Reviewed: Allergy & Precautions, H&P , NPO status , Patient's Chart, lab work & pertinent test results, reviewed documented beta blocker date and time   Airway Mallampati: II TM Distance: >3 FB Neck ROM: full    Dental  (+) Teeth Intact and Dental Advisory Given   Pulmonary neg pulmonary ROS,  breath sounds clear to auscultation        Cardiovascular negative cardio ROS  Rhythm:regular     Neuro/Psych CVA, No Residual Symptoms negative psych ROS   GI/Hepatic Neg liver ROS, hiatal hernia,   Endo/Other  negative endocrine ROS  Renal/GU Renal InsufficiencyRenal disease  negative genitourinary   Musculoskeletal   Abdominal (+)  Abdomen: soft. Bowel sounds: normal.  Peds  Hematology negative hematology ROS (+)   Anesthesia Other Findings See surgeon's H&P   Reproductive/Obstetrics negative OB ROS                         Anesthesia Physical Anesthesia Plan  ASA: II  Anesthesia Plan: General   Post-op Pain Management:    Induction: Intravenous  Airway Management Planned: Oral ETT  Additional Equipment:   Intra-op Plan:   Post-operative Plan: Extubation in OR  Informed Consent: I have reviewed the patients History and Physical, chart, labs and discussed the procedure including the risks, benefits and alternatives for the proposed anesthesia with the patient or authorized representative who has indicated his/her understanding and acceptance.   Dental Advisory Given  Plan Discussed with: CRNA and Surgeon  Anesthesia Plan Comments:         Anesthesia Quick Evaluation

## 2013-02-25 NOTE — Preoperative (Signed)
Beta Blockers   Reason not to administer Beta Blockers:Not Applicable 

## 2013-02-25 NOTE — Progress Notes (Signed)
Taking ice chips without difficulty.

## 2013-02-25 NOTE — Anesthesia Postprocedure Evaluation (Signed)
Anesthesia Post Note  Patient: Frank Benitez  Procedure(s) Performed: Procedure(s) (LRB): LAPAROSCOPIC INGUINAL HERNIA (N/A) HERNIA REPAIR INGUINAL ADULT (Left)  Anesthesia type: General  Patient location: PACU  Post pain: Pain level controlled  Post assessment: Patient's Cardiovascular Status Stable  Last Vitals:  Filed Vitals:   02/25/13 1730  BP: 125/62  Pulse: 60  Temp:   Resp: 12    Post vital signs: Reviewed and stable  Level of consciousness: alert  Complications: No apparent anesthesia complications

## 2013-02-27 ENCOUNTER — Encounter (HOSPITAL_COMMUNITY): Payer: Self-pay | Admitting: Surgery

## 2013-03-04 ENCOUNTER — Ambulatory Visit (INDEPENDENT_AMBULATORY_CARE_PROVIDER_SITE_OTHER): Payer: BC Managed Care – PPO | Admitting: Surgery

## 2013-03-04 VITALS — BP 122/72 | HR 78 | Temp 98.0°F | Resp 18 | Ht 70.0 in | Wt 189.0 lb

## 2013-03-04 DIAGNOSIS — K4090 Unilateral inguinal hernia, without obstruction or gangrene, not specified as recurrent: Secondary | ICD-10-CM

## 2013-03-04 NOTE — Progress Notes (Signed)
Re:   Frank Benitez  "Frank Benitez" DOB:   1946/07/05 MRN:   [id number]  ASSESSMENT AND PLAN: 1.  Left inguinal hernia, symptomatic  Lap converted to open left inguinal hernia repair - 02/26/2013 - D. Breyana Follansbee  Early bruising, has done well.  Return appointment PRN.  2.  History of renal cell ca  Left nephrectomy, laparoscopically, and partial right nephrectomy.  Removed at [facility], 2003 3.  History of mild CVA (maybe more cerebral hemorrhage)  No residual neurologic defect. 4.  Ureteral stones - 05/2010  Solitary right kidney - Dr. Luella Cook  Note: had a partial right nephrectomy 5.  Gout 6.  Hypercholesterolemia 7.  Right colon resection for benign tumor,  enterolysis of adhesions, primary repair of ventral hernia - 03/07/2010 - [facility]. [facility]  Chief Complaint  Patient presents with  . Routine Post Op    L/H   REFERRING PHYSICIAN: FRIED, ROBERT L, MD  HISTORY OF PRESENT ILLNESS: Frank Benitez is a 67 y.o. (DOB: [birth year]) white  male whose primary care physician is FRIED, ROBERT L, MD and comes for post op follow up of left inguinal hernia.  He had a lot of bruising about the 2nd and 3rd day and was concerned about it, but it cleared fairly quickly after that and is doing fine now.  I discussed that because of a double dissection (laparoscopic and open) and because the hernia was indirect may contribute to the bruising.  He is planning a trip to [place] to leave this weekend, which ought to be okay.  No lifting > 15 pounds for 4 weeks post op.  History of Inguinal Hernia: The patient had a right hemicolectomy by Dr. Estelle Grumbles on 03/04/2010. This was for a benign mass in his right colon.   At surgery, Dr. Michaell Cowing noted adhesions, a ventral hernia (which he repaired), and a left inguinal hernia. In his operative note, he questioned a right groin weakness.  At that time the inguinal hernia was asymptomatic and not noticed by the patient.  The patient saw Dr. Dewaine Oats about 6  months ago who noted a left inguinal hernia.  Still at that time, the patient had had no symptoms.  He has begun an exercise program with his family where he is walking 3-4 times per day. Yesterday, he was walking and had terrible pain in his left groin. He did not feel a bulge, but pressure on the groin helped. Today, his left groin has a burning sensation and bruised feeling, but is better than yesterday.    Besides the colon surgery in 2011, he had a left nephrectomy and partial right nephrectomy in 2003 at [facility].   Past Medical History  Diagnosis Date  . Hyperlipidemia   . Chronic kidney disease   . Stroke   . H/O hiatal hernia   . Cancer     kidney  . Gout      Current Outpatient Prescriptions  Medication Sig Dispense Refill  . allopurinol (ZYLOPRIM) 100 MG tablet Take 100 mg by mouth daily.      Marland Kitchen atorvastatin (LIPITOR) 20 MG tablet Take 20 mg by mouth daily.      . Multiple Vitamin (MULTIVITAMIN WITH MINERALS) TABS Take 1 tablet by mouth daily.      Marland Kitchen HYDROcodone-acetaminophen (NORCO/VICODIN) 5-325 MG per tablet Take 1-2 tablets by mouth every 6 (six) hours as needed for pain.  30 tablet  1   No current facility-administered medications for this  visit.    No Known Allergies  REVIEW OF SYSTEMS: Neurologic:  History of mild CVA (maybe more cerebral hemorrhage) - 1999. Pulmonary:  Quit smoking 2003. No asthma or bronchitis.  No OSA/CPAP.  Gastrointestinal:  No history of stomach disease.  No history of liver disease.  No history of gall bladder disease.  No history of pancreas disease.  Right colon resection for benign tumor,enterolysis of adhesions, primary repair of ventral hernia - 03/07/2010 - [facility].  Dr. Bosie Clos is gastroenterologist. Urologic:  History of renal cell ca.  Left nephrectomy, laparoscopically, and partial right nephrectomy. Removed at [facility], 2003.  Ureteral stones - 05/2010- Dr. Luella Cook.  Normal creatinine on labs from  05/17/2012. Musculoskeletal:  Foot injury 2002.  SOCIAL and FAMILY HISTORY: Wife and daughter are with the patient. He is retired from [employer].  H e spent 6 years in [place], [place].  PHYSICAL EXAM: BP 122/72  Pulse 78  Temp(Src) 98 F (36.7 C)  Resp 18  Ht 5\' 10"  (1.778 m)  Wt 189 lb (85.73 kg)  BMI 27.12 kg/m2  General: WN WM who is alert and generally healthy appearing.  Abdomen: Left groin incision a little puffy, but wound looks okay.  Incision at umbilicus and RLQ are okay.  DATA REVIEWED: Path to patient  Ovidio Kin, MD,  [facility], [place] [address] [place]  [address]   [place], [place]    [address] Phone:  [phone] FAX:  [phone]

## 2013-03-07 ENCOUNTER — Encounter (INDEPENDENT_AMBULATORY_CARE_PROVIDER_SITE_OTHER): Payer: BC Managed Care – PPO | Admitting: Surgery

## 2013-05-22 ENCOUNTER — Other Ambulatory Visit (HOSPITAL_COMMUNITY): Payer: Self-pay | Admitting: Urology

## 2013-05-22 DIAGNOSIS — D49519 Neoplasm of unspecified behavior of unspecified kidney: Secondary | ICD-10-CM

## 2013-06-19 ENCOUNTER — Ambulatory Visit (HOSPITAL_COMMUNITY)
Admission: RE | Admit: 2013-06-19 | Discharge: 2013-06-19 | Disposition: A | Discharge status: Home / Self Care | Payer: MEDICARE | Source: Ambulatory Visit | Attending: Urology | Admitting: Urology

## 2013-06-19 DIAGNOSIS — N281 Cyst of kidney, acquired: Secondary | ICD-10-CM | POA: Insufficient documentation

## 2013-06-19 DIAGNOSIS — Z905 Acquired absence of kidney: Secondary | ICD-10-CM | POA: Insufficient documentation

## 2013-06-19 DIAGNOSIS — Z85528 Personal history of other malignant neoplasm of kidney: Secondary | ICD-10-CM | POA: Insufficient documentation

## 2013-06-19 DIAGNOSIS — D49519 Neoplasm of unspecified behavior of unspecified kidney: Secondary | ICD-10-CM

## 2013-06-19 LAB — CREATININE, SERUM: GFR calc Af Amer: >90 mL/min (ref >90)

## 2013-06-19 MED ORDER — GADOBENATE DIMEGLUMINE 529 MG/ML IV SOLN
20.0000 mL | Freq: Once | INTRAVENOUS | Status: AC | PRN
  Administered 2013-06-19: 17 mL via INTRAVENOUS

## 2013-06-20 ENCOUNTER — Other Ambulatory Visit (HOSPITAL_COMMUNITY): Payer: MEDICARE

## 2014-01-06 ENCOUNTER — Other Ambulatory Visit: Payer: Self-pay | Admitting: Family Medicine

## 2014-06-22 ENCOUNTER — Other Ambulatory Visit: Payer: Self-pay | Admitting: Gastroenterology
